# Patient Record
Sex: Male | Born: 2000 | Race: Black or African American | Hispanic: No | Marital: Single | Smoking: Never smoker
Health system: Southern US, Community
[De-identification: ages and names within clinical notes are randomized; demographics above are authoritative.]

---
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
## 2019-01-14 ENCOUNTER — Telehealth: Payer: Self-pay | Admitting: *Deleted

## 2019-01-14 NOTE — Telephone Encounter (Signed)
I called the given number and spoke with pt and he states he had received a call and his dtr now has an appt.

## 2019-01-14 NOTE — Telephone Encounter (Signed)
Pt called and request a call back.

## 2019-03-09 ENCOUNTER — Ambulatory Visit: Payer: Self-pay

## 2019-03-11 ENCOUNTER — Ambulatory Visit: Payer: BC Managed Care – PPO | Attending: Internal Medicine

## 2019-03-11 DIAGNOSIS — Z20822 Contact with and (suspected) exposure to covid-19: Secondary | ICD-10-CM

## 2019-03-11 DIAGNOSIS — U071 COVID-19: Secondary | ICD-10-CM | POA: Insufficient documentation

## 2019-03-13 LAB — NOVEL CORONAVIRUS, NAA: SARS-CoV-2, NAA: DETECTED — AB

## 2019-03-18 ENCOUNTER — Other Ambulatory Visit: Payer: Self-pay

## 2019-08-06 ENCOUNTER — Ambulatory Visit: Payer: Self-pay | Attending: Internal Medicine

## 2019-08-06 DIAGNOSIS — Z23 Encounter for immunization: Secondary | ICD-10-CM

## 2019-08-06 NOTE — Progress Notes (Signed)
   Covid-19 Vaccination Clinic  Name:  Kevin Woods    MRN: [id number] DOB: 28-Jun-2000  08/06/2019  [name] was observed post Covid-19 immunization for 15 minutes without incident. He was provided with Vaccine Information Sheet and instruction to access the V-Safe system.   [name] was instructed to call 911 with any severe reactions post vaccine: Marland Kitchen Difficulty breathing  . Swelling of face and throat  . A fast heartbeat  . A bad rash all over body  . Dizziness and weakness   Immunizations Administered    Name Date Dose VIS Date Route   Pfizer COVID-19 Vaccine 08/06/2019  1:04 PM 0.3 mL 04/22/2018 Intramuscular   Manufacturer: [employer], [employer]   Lot: [id number]   NDC: [address]-3

## 2019-08-13 ENCOUNTER — Encounter (HOSPITAL_COMMUNITY): Payer: Self-pay

## 2019-08-13 ENCOUNTER — Other Ambulatory Visit: Payer: Self-pay

## 2019-08-13 ENCOUNTER — Ambulatory Visit (HOSPITAL_COMMUNITY)
Admission: EM | Admit: 2019-08-13 | Discharge: 2019-08-13 | Disposition: A | Discharge status: Home / Self Care | Payer: BC Managed Care – PPO

## 2019-08-13 DIAGNOSIS — M79605 Pain in left leg: Secondary | ICD-10-CM

## 2019-08-13 DIAGNOSIS — S81812A Laceration without foreign body, left lower leg, initial encounter: Secondary | ICD-10-CM | POA: Diagnosis not present

## 2019-08-13 MED ORDER — LIDOCAINE-EPINEPHRINE (PF) 2 %-1:200000 IJ SOLN
INTRAMUSCULAR | Status: AC
  Filled 2019-08-13: qty 20
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
## 2019-08-13 NOTE — Discharge Instructions (Signed)
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
